# Patient Record
Sex: Female | Born: 1935 | Race: White | Hispanic: No | Smoking: Current every day smoker
Health system: Southern US, Community
[De-identification: ages and names within clinical notes are randomized; demographics above are authoritative.]

## PROBLEM LIST (undated history)

## (undated) DIAGNOSIS — G2581 Restless legs syndrome: Secondary | ICD-10-CM

## (undated) HISTORY — PX: TONSILLECTOMY: SUR1361

## (undated) HISTORY — PX: ABDOMINAL HYSTERECTOMY: SHX81

---

## 2000-05-02 ENCOUNTER — Other Ambulatory Visit: Admission: RE | Admit: 2000-05-02 | Discharge: 2000-05-02 | Payer: Self-pay | Admitting: Surgery

## 2000-05-02 ENCOUNTER — Encounter (INDEPENDENT_AMBULATORY_CARE_PROVIDER_SITE_OTHER): Payer: Self-pay | Admitting: Specialist

## 2004-03-23 ENCOUNTER — Ambulatory Visit (HOSPITAL_COMMUNITY): Admission: RE | Admit: 2004-03-23 | Discharge: 2004-03-23 | Payer: Self-pay | Admitting: *Deleted

## 2007-11-06 ENCOUNTER — Emergency Department (HOSPITAL_COMMUNITY): Admission: EM | Admit: 2007-11-06 | Discharge: 2007-11-06 | Payer: Self-pay | Admitting: Emergency Medicine

## 2009-01-10 ENCOUNTER — Observation Stay (HOSPITAL_COMMUNITY): Admission: EM | Admit: 2009-01-10 | Discharge: 2009-01-11 | Payer: Self-pay | Admitting: Emergency Medicine

## 2009-01-11 ENCOUNTER — Encounter (INDEPENDENT_AMBULATORY_CARE_PROVIDER_SITE_OTHER): Payer: Self-pay | Admitting: Internal Medicine

## 2010-08-20 LAB — COMPREHENSIVE METABOLIC PANEL
AST: 25 U/L (ref 0–37)
Albumin: 3.5 g/dL (ref 3.5–5.2)
Albumin: 3.5 g/dL (ref 3.5–5.2)
BUN: 13 mg/dL (ref 6–23)
CO2: 32 mEq/L (ref 19–32)
Calcium: 8.5 mg/dL (ref 8.4–10.5)
Calcium: 9.2 mg/dL (ref 8.4–10.5)
Creatinine, Ser: 0.57 mg/dL (ref 0.4–1.2)
GFR calc Af Amer: >60 mL/min (ref >60)
GFR calc non Af Amer: >60 mL/min (ref >60)
Glucose, Bld: 93 mg/dL (ref 70–99)
Sodium: 136 mEq/L (ref 135–145)
Total Protein: 6.3 g/dL (ref 6.0–8.3)
Total Protein: 6.5 g/dL (ref 6.0–8.3)

## 2010-08-20 LAB — LIPID PANEL
LDL Cholesterol: 151 mg/dL — ABNORMAL HIGH (ref 0–99)
Triglycerides: 108 mg/dL (ref <150)
VLDL: 22 mg/dL (ref 0–40)

## 2010-08-20 LAB — POCT CARDIAC MARKERS
Myoglobin, poc: 56.6 ng/mL (ref 12–200)
Troponin i, poc: <0.05 ng/mL (ref 0.00–0.09)

## 2010-08-20 LAB — CBC
Hemoglobin: 13 g/dL (ref 12.0–15.0)
MCHC: 33.4 g/dL (ref 30.0–36.0)
Platelets: 346 10*3/uL (ref 150–400)
RDW: 12.7 % (ref 11.5–15.5)

## 2010-08-20 LAB — CK TOTAL AND CKMB (NOT AT ARMC)
CK, MB: 1.8 ng/mL (ref 0.3–4.0)
Relative Index: INVALID (ref 0.0–2.5)
Total CK: 57 U/L (ref 7–177)

## 2010-08-20 LAB — DIFFERENTIAL
Lymphs Abs: 1.6 10*3/uL (ref 0.7–4.0)
Monocytes Absolute: 0.6 10*3/uL (ref 0.1–1.0)
Monocytes Relative: 10 % (ref 3–12)
Neutro Abs: 3.5 10*3/uL (ref 1.7–7.7)
Neutrophils Relative %: 58 % (ref 43–77)

## 2010-08-20 LAB — PROTIME-INR
INR: 1 (ref 0.00–1.49)
Prothrombin Time: 12.8 seconds (ref 11.6–15.2)

## 2010-08-20 LAB — CARDIAC PANEL(CRET KIN+CKTOT+MB+TROPI)
CK, MB: 1.8 ng/mL (ref 0.3–4.0)
CK, MB: 1.8 ng/mL (ref 0.3–4.0)
Troponin I: 0.02 ng/mL (ref 0.00–0.06)

## 2010-08-20 LAB — APTT: aPTT: 29 seconds (ref 24–37)

## 2010-08-20 LAB — TSH: TSH: 7.121 u[IU]/mL — ABNORMAL HIGH (ref 0.350–4.500)

## 2010-09-27 NOTE — H&P (Signed)
NAME:  Alexa Palmer, Alexa Palmer NO.:  [id number]   MEDICAL RECORD NO.:  [id number]          PATIENT TYPE:  EMS   LOCATION:  MAJO                         FACILITY:  [facility]   PHYSICIAN:  Michiel Cowboy, MDDATE OF BIRTH:  09-11-1933   DATE OF ADMISSION:  01/10/2009  DATE OF DISCHARGE:                              HISTORY & PHYSICAL   PRIMARY CARE [facility]:  Dr. Miguel Aschoff.   CHIEF COMPLAINT:  Chest pain.   Patient is a 77 year old female with past medical history significant  for restless leg syndrome and mild depression.  Patient has been doing  actually fairly well, was saying she was at her baseline of health when  she went to church this morning.  While sitting down in church, she  developed substernal chest pain which she describes as somewhat sharp,  drumming like, was not worse with deep inspiration or position.  She did  have some shortness of breath associated with this.  It also radiated  somewhat to her upper, as well as lower jaws.  She had a mild headache  and felt a little clammy.  This has lasted for about 10 minutes and then  she was assessed by a nurse who was at her baseline who recommended  further evaluation.  Of note, she has had those episodes before over  past year so it happened about twice or so, usually not at the time of  exertion but rather at rest.  She does exercise regularly and never has  any chest pain or shortness of breath while exercising.  She denies any  extra stress during her life currently.  No fevers.  No chills.  No  diarrhea.  No constipation.   REVIEW OF SYSTEMS:  Otherwise negative.  No lower extremity edema.  No  dyspnea on exertion and no PND or orthopnea noted.   REVIEW OF SYSTEMS:  Otherwise unremarkable.   PAST MEDICAL HISTORY:  Significant for:  1. Depression.  2. Restless leg syndrome.   SOCIAL HISTORY:  Patient does not smoke or drink.  Does not abuse drugs.   FAMILY HISTORY:  Only significant for breast  cancer in mother and her  sister.   ALLERGIES:  PENICILLIN.   MEDICATIONS:  1. Prozac, unknown dose.  2. Estradiol, unknown dose.  3. Also an older drug for restless leg syndrome, name of which she      cannot remember.   VITALS:  Temperature 96.8.  Blood pressure 108/61.  Pulse 52.  Respirations 14.  Saturation 98% on room air.  Patient appears to be in  no acute distress.  HEAD:  Nontraumatic.  Moist mucous membranes.  LUNGS:  Clear to auscultation bilaterally.  HEART:  Regular rate and rhythm.  No murmurs appreciated.  ABDOMEN:  Soft, nontender, nondistended.  LOWER EXTREMITIES:  No clubbing, cyanosis, or edema.  NEUROLOGIC:  The patient grossly intact.  SKIN:  Clean, dry, and intact.   LABS:  White blood cell count 6.0, hemoglobin 13, sodium 136, potassium  5.3 but is hemolyzed specimen, creatinine 0.59, total bili 1.4, AST 43,  ALT 15, albumin 3.5.  Chest x-ray showing cardiomegaly but  no acute  cardiopulmonary disease.  Cardiac enzymes as negative.  EKG showing no  evidence of ischemic changes, heart rate of 52 but sinus bradycardia.   ASSESSMENT AND PLAN:  1. This is a 77 year old female with chest pain.  No significant risk      factors except for her age but recurrent episodes of chest pain      which are somewhat atypical.  For the chest pain, given age we will      cycle cardiac markers.  Given cardiomegaly on the chest x-ray, we      will obtain a 2D echo.  Given sharp-like sensation, we will obtain      a D-dimer, especially since she had some associated shortness of      breath although pulmonary embolism is less likely.  2. Elevated potassium, likely secondary to hemolysis.  We will repeat.   PROPHYLAXIS:  Protonix and Lovenox.      Michiel Cowboy, MD  Electronically Signed     AVD/MEDQ  D:  01/10/2009  T:  01/10/2009  Job:  [id number]   cc:   Miguel Aschoff, M.D.

## 2010-09-27 NOTE — Discharge Summary (Signed)
NAME:  Alexa Palmer, Alexa Palmer NO.:  [id number]   MEDICAL RECORD NO.:  [id number]          PATIENT TYPE:  INP   LOCATION:  2004                         FACILITY:  [facility]   PHYSICIAN:  Michiel Cowboy, MDDATE OF BIRTH:  [birth year]   DATE OF ADMISSION:  01/10/2009  DATE OF DISCHARGE:  01/11/2009                               DISCHARGE SUMMARY   .   DISCHARGE DIAGNOSIS:  1. Chest pain.  2. Cardiomegaly with normal echogram.  3. Hyperlipidemia.  4. Restless leg syndrome.  5. Mild depression.   STUDIES:  EKG showing no ischemic changes.  Cardiac markers negative x3.  Chest x-ray showing cardiomegaly.   Echogram done on 08/30 of showing left ventricle normal size.  Systolic  function was normal.  Wall motion normal.  Mild aortic regurgitation.   HOSPITAL COURSE:  The patient is a pleasant 77 year old female who had  been having intermittent chest pains in the past usually nonexertional.  She had an episode lasting about 10 minutes while she was at church with  some associated shortness of breath and diaphoresis.  She was evaluated  for chest pain.  Cardiac markers were negative x3.  No EKG changes were  noted.  Her echo did not show any wall motion abnormality.  The patient  was set up to see Dr. Anne Fu in the office in the a.m. tomorrow and will  probably  benefit from stress test as an outpatient since she is having  recurrent episode of chest pain and reportedly never had a stress as  before.  Of note, her cholesterol was noted to be elevated.  She was  started on Lipitor prior to discharge as well as baby aspirin.  If her  stress test is normal and she wishes to try to control her lipids with  diet only, this could be addressed as an outpatient after she has been  seen by cardiology.   DISCHARGE MEDICATIONS:  1. Lipitor 10 mg p.o. daily.  2. Aspirin 81 mg p.o. daily.  3. Prozac resume home dose.  4. Estradiol can resume home dose.   The patient is to hold  quinine that she has been taking for her restless  leg as this can cause arrhythmias until she has been seen by  cardiologist.   FOLLOW UP:  The patient is to see Dr. Anne Fu on August 31 at 9:00 a.m.  From there her stress test could be arranged.  The patient to see her  primary care [facility], Dr. Miguel Aschoff within a week.      Michiel Cowboy, MD  Electronically Signed     AVD/MEDQ  D:  01/11/2009  T:  01/11/2009  Job:  [id number]   cc:   Miguel Aschoff, M.D.  Jake Bathe, MD

## 2012-03-20 ENCOUNTER — Emergency Department (HOSPITAL_COMMUNITY)
Admission: EM | Admit: 2012-03-20 | Discharge: 2012-03-20 | Disposition: A | Discharge status: Home / Self Care | Payer: Medicare Other | Attending: Emergency Medicine | Admitting: Emergency Medicine

## 2012-03-20 ENCOUNTER — Emergency Department (HOSPITAL_COMMUNITY): Payer: Medicare Other

## 2012-03-20 ENCOUNTER — Encounter (HOSPITAL_COMMUNITY): Payer: Self-pay | Admitting: *Deleted

## 2012-03-20 DIAGNOSIS — Y9302 Activity, running: Secondary | ICD-10-CM | POA: Insufficient documentation

## 2012-03-20 DIAGNOSIS — Z79899 Other long term (current) drug therapy: Secondary | ICD-10-CM | POA: Insufficient documentation

## 2012-03-20 DIAGNOSIS — W19XXXA Unspecified fall, initial encounter: Secondary | ICD-10-CM

## 2012-03-20 DIAGNOSIS — W1789XA Other fall from one level to another, initial encounter: Secondary | ICD-10-CM | POA: Insufficient documentation

## 2012-03-20 DIAGNOSIS — Y929 Unspecified place or not applicable: Secondary | ICD-10-CM | POA: Insufficient documentation

## 2012-03-20 DIAGNOSIS — S1093XA Contusion of unspecified part of neck, initial encounter: Secondary | ICD-10-CM | POA: Insufficient documentation

## 2012-03-20 DIAGNOSIS — R51 Headache: Secondary | ICD-10-CM | POA: Insufficient documentation

## 2012-03-20 DIAGNOSIS — G2581 Restless legs syndrome: Secondary | ICD-10-CM | POA: Insufficient documentation

## 2012-03-20 DIAGNOSIS — S0093XA Contusion of unspecified part of head, initial encounter: Secondary | ICD-10-CM

## 2012-03-20 DIAGNOSIS — S0003XA Contusion of scalp, initial encounter: Secondary | ICD-10-CM | POA: Insufficient documentation

## 2012-03-20 HISTORY — DX: Restless legs syndrome: G25.81

## 2012-03-20 LAB — CBC WITH DIFFERENTIAL/PLATELET
Basophils Absolute: 0 10*3/uL (ref 0.0–0.1)
Basophils Relative: 1 % (ref 0–1)
Eosinophils Relative: 4 % (ref 0–5)
HCT: 35.6 % — ABNORMAL LOW (ref 36.0–46.0)
MCHC: 34.6 g/dL (ref 30.0–36.0)
MCV: 93.2 fL (ref 78.0–100.0)
Monocytes Absolute: 0.3 10*3/uL (ref 0.1–1.0)
Neutro Abs: 3 10*3/uL (ref 1.7–7.7)
RDW: 12.7 % (ref 11.5–15.5)

## 2012-03-20 LAB — BASIC METABOLIC PANEL
Calcium: 8.9 mg/dL (ref 8.4–10.5)
Creatinine, Ser: 0.64 mg/dL (ref 0.50–1.10)
GFR calc Af Amer: >90 mL/min (ref >90)

## 2012-03-20 MED ORDER — HYDROCODONE-ACETAMINOPHEN 5-325 MG PO TABS: ORAL_TABLET | ORAL | Status: AC

## 2012-03-20 MED ORDER — IBUPROFEN 800 MG PO TABS
ORAL_TABLET | ORAL | Status: AC
  Administered 2012-03-20: 800 mg via ORAL
  Filled 2012-03-20: qty 1

## 2012-03-20 MED ORDER — IBUPROFEN 800 MG PO TABS
800.0000 mg | ORAL_TABLET | Freq: Once | ORAL | Status: AC
  Administered 2012-03-20: 800 mg via ORAL

## 2012-03-20 NOTE — ED Notes (Signed)
Pt reports she was out for her morning run and fell. Pt reports she feels very disoriented and confused. Pt able to state where she is, year and president. Does not know month, does not remember falling or what she could have hit her head on. Pt presents with clothing very bloody from head lac. Small 1cm lac noted to area above L eyebrow. Bleeding controlled currently. Swelling and bruising noted to same area. Pt denies pain elsewhere, only pain is at head lac.

## 2012-03-20 NOTE — ED Provider Notes (Signed)
History     CSN: [id number]  Arrival date & time 03/20/12  0756   First MD Initiated Contact with Patient 03/20/12 0759      Chief Complaint  Patient presents with  . Fall  . Head Laceration    (Consider location/radiation/quality/duration/timing/severity/associated sxs/prior treatment) Patient is a 78 y.o. female presenting with fall and scalp laceration. The history is provided by the patient (the pt states she fell and hit her head.  she has a headache and left chest pain). No language interpreter was used.  Fall The accident occurred 1 to 2 hours ago. The fall occurred while running. She fell from a height of 1 to 2 ft. She landed on concrete. The point of impact was the head. The pain is present in the head. The pain is at a severity of 4/10. The pain is moderate. She was ambulatory at the scene. There was no entrapment after the fall. There was no drug use involved in the accident. There was no alcohol use involved in the accident. Associated symptoms include headaches. Pertinent negatives include no visual change, no abdominal pain and no hematuria. Exacerbated by: nothing. She has tried nothing for the symptoms.  Head Laceration Associated symptoms include chest pain and headaches. Pertinent negatives include no abdominal pain.    Past Medical History  Diagnosis Date  . Restless leg syndrome     Past Surgical History  Procedure Date  . Abdominal hysterectomy     No family history on file.  History  Substance Use Topics  . Smoking status: Not on file  . Smokeless tobacco: Not on file  . Alcohol Use:     OB History    Grav Para Term Preterm Abortions TAB SAB Ect Mult Living                  Review of Systems  Constitutional: Negative for fatigue.  HENT: Negative for congestion, sinus pressure and ear discharge.   Eyes: Negative for discharge.  Respiratory: Negative for cough.   Cardiovascular: Positive for chest pain.  Gastrointestinal: Negative for  abdominal pain and diarrhea.  Genitourinary: Negative for frequency and hematuria.  Musculoskeletal: Negative for back pain.  Skin: Negative for rash.  Neurological: Positive for headaches. Negative for seizures.  Hematological: Negative.   Psychiatric/Behavioral: Negative for hallucinations.    Allergies  Penicillins  Home Medications   Current Outpatient Rx  Name  Route  Sig  Dispense  Refill  . CLONAZEPAM 0.5 MG PO TABS   Oral   Take 0.5 mg by mouth every evening.         Marland Kitchen ESTRADIOL 1 MG PO TABS   Oral   Take 1 mg by mouth daily.         Marland Kitchen FLUOXETINE HCL 10 MG PO CAPS   Oral   Take 10 mg by mouth daily.         Marland Kitchen HYDROCODONE-ACETAMINOPHEN 5-325 MG PO TABS      Take one every 6 hours for pain not helped by tylenol or motrin   20 tablet   0     BP 141/66  Pulse 53  Temp 97.7 F (36.5 C) (Oral)  Resp 20  SpO2 97%  Physical Exam  Constitutional: She is oriented to person, place, and time. She appears well-developed.  HENT:  Head: Normocephalic.       Abrasions and swelling left forehead  Eyes: Conjunctivae normal and EOM are normal. No scleral icterus.  Neck: Neck supple. No  thyromegaly present.  Cardiovascular: Normal rate and regular rhythm.  Exam reveals no gallop and no friction rub.   No murmur heard. Pulmonary/Chest: No stridor. She has no wheezes. She has no rales. She exhibits tenderness.       Tender left chest  Abdominal: She exhibits no distension. There is no tenderness. There is no rebound.  Musculoskeletal: Normal range of motion. She exhibits no edema.  Lymphadenopathy:    She has no cervical adenopathy.  Neurological: She is oriented to person, place, and time. Coordination normal.  Skin: No rash noted. No erythema.  Psychiatric: She has a normal mood and affect. Her behavior is normal.    ED Course  Procedures (including critical care time)  Labs Reviewed  CBC WITH DIFFERENTIAL - Abnormal; Notable for the following:    RBC  3.82 (*)     HCT 35.6 (*)     All other components within normal limits  BASIC METABOLIC PANEL - Abnormal; Notable for the following:    GFR calc non Af Amer 85 (*)     All other components within normal limits   Dg Ribs Unilateral W/chest Left  03/20/2012  *RADIOLOGY REPORT*  Clinical Data: Fall.  Head laceration.  LEFT RIBS AND CHEST - 3+ VIEW  Comparison: 01/10/2009.  Findings: Chronic bronchitic changes are present.  Emphysema. Cardiopericardial silhouette appears within normal limits. No pneumothorax.  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 Left old left eighth and ninth rib fractures are present, previously demonstrated 11/06/2007.  No acute displaced rib fractures are identified.  No pleural fluid.  IMPRESSION: Old left-sided rib fractures.  No acute displaced rib fracture identified.   Original Report Authenticated By: Andreas Newport, M.D.    Ct Head Wo Contrast  03/20/2012  *RADIOLOGY REPORT*  Clinical Data:  Fall, head laceration, neck pain  CT HEAD WITHOUT CONTRAST CT CERVICAL SPINE WITHOUT CONTRAST  Technique:  Multidetector CT imaging of the head and cervical spine was performed following the standard protocol without intravenous contrast.  Multiplanar CT image reconstructions of the cervical spine were also generated.  Comparison:   None  CT HEAD  Findings: Periventricular and subcortical white matter hypodensities are nonspecific however most in keeping with chronic microangiopathic change. There is no evidence for acute hemorrhage, hydrocephalus, mass lesion, or abnormal extra-axial fluid collection.  No definite CT evidence for acute infarction.  Left scalp hematoma superolateral to the left orbit.  Small associated locule of gas.  No underlying fracture.  Globes are symmetric.  No retrobulbar hematoma.The visualized paranasal sinuses and mastoid air cells are predominately clear.  IMPRESSION: Left scalp hematoma.  No underlying calvarial fracture or acute intracranial  abnormality.  Nonspecific white matter changes as above.  CT CERVICAL SPINE  Findings: Limited images through the lung apices shows scarring/posterior reticular high attenuation.  Multilevel facet arthropathy and multilevel degenerative disc disease, most pronounced at C6-7.  Maintained craniocervical relationship.  No dens fracture.  There is minimal anterolisthesis of C4 on C5 and C5 on C6. Maintained vertebral body heights. Paravertebral soft tissues within normal limits.  IMPRESSION: Multilevel degenerative changes and minimal C4 on C5 and C5 on C6 anterolisthesis.  No static evidence for acute fracture or dislocation.  Nonspecific peripheral/subpleural calcific interstitial thickening, perhaps pulmonary ossification in the setting of underlying fibrosis.  Consider a non emergent chest CT follow-up.   Original Report Authenticated By: Jearld Lesch, M.D.    Ct Cervical Spine Wo Contrast  03/20/2012  *RADIOLOGY REPORT*  Clinical Data:  Fall,  head laceration, neck pain  CT HEAD WITHOUT CONTRAST CT CERVICAL SPINE WITHOUT CONTRAST  Technique:  Multidetector CT imaging of the head and cervical spine was performed following the standard protocol without intravenous contrast.  Multiplanar CT image reconstructions of the cervical spine were also generated.  Comparison:   None  CT HEAD  Findings: Periventricular and subcortical white matter hypodensities are nonspecific however most in keeping with chronic microangiopathic change. There is no evidence for acute hemorrhage, hydrocephalus, mass lesion, or abnormal extra-axial fluid collection.  No definite CT evidence for acute infarction.  Left scalp hematoma superolateral to the left orbit.  Small associated locule of gas.  No underlying fracture.  Globes are symmetric.  No retrobulbar hematoma.The visualized paranasal sinuses and mastoid air cells are predominately clear.  IMPRESSION: Left scalp hematoma.  No underlying calvarial fracture or acute intracranial  abnormality.  Nonspecific white matter changes as above.  CT CERVICAL SPINE  Findings: Limited images through the lung apices shows scarring/posterior reticular high attenuation.  Multilevel facet arthropathy and multilevel degenerative disc disease, most pronounced at C6-7.  Maintained craniocervical relationship.  No dens fracture.  There is minimal anterolisthesis of C4 on C5 and C5 on C6. Maintained vertebral body heights. Paravertebral soft tissues within normal limits.  IMPRESSION: Multilevel degenerative changes and minimal C4 on C5 and C5 on C6 anterolisthesis.  No static evidence for acute fracture or dislocation.  Nonspecific peripheral/subpleural calcific interstitial thickening, perhaps pulmonary ossification in the setting of underlying fibrosis.  Consider a non emergent chest CT follow-up.   Original Report Authenticated By: Jearld Lesch, M.D.      1. Fall   2. Head contusion       MDM          Benny Lennert, MD 03/20/12 1136

## 2015-02-15 DIAGNOSIS — H0019 Chalazion unspecified eye, unspecified eyelid: Secondary | ICD-10-CM | POA: Diagnosis not present

## 2015-02-15 DIAGNOSIS — L814 Other melanin hyperpigmentation: Secondary | ICD-10-CM | POA: Diagnosis not present

## 2015-02-15 DIAGNOSIS — D225 Melanocytic nevi of trunk: Secondary | ICD-10-CM | POA: Diagnosis not present

## 2015-02-15 DIAGNOSIS — L821 Other seborrheic keratosis: Secondary | ICD-10-CM | POA: Diagnosis not present

## 2015-03-03 DIAGNOSIS — Z0100 Encounter for examination of eyes and vision without abnormal findings: Secondary | ICD-10-CM | POA: Diagnosis not present

## 2015-03-03 DIAGNOSIS — H5203 Hypermetropia, bilateral: Secondary | ICD-10-CM | POA: Diagnosis not present

## 2015-03-19 DIAGNOSIS — C44119 Basal cell carcinoma of skin of left eyelid, including canthus: Secondary | ICD-10-CM | POA: Diagnosis not present

## 2015-03-19 DIAGNOSIS — D485 Neoplasm of uncertain behavior of skin: Secondary | ICD-10-CM | POA: Diagnosis not present

## 2015-04-27 DIAGNOSIS — H2511 Age-related nuclear cataract, right eye: Secondary | ICD-10-CM | POA: Diagnosis not present

## 2015-04-27 DIAGNOSIS — H2512 Age-related nuclear cataract, left eye: Secondary | ICD-10-CM | POA: Diagnosis not present

## 2015-04-27 DIAGNOSIS — H02839 Dermatochalasis of unspecified eye, unspecified eyelid: Secondary | ICD-10-CM | POA: Diagnosis not present

## 2015-04-27 DIAGNOSIS — H18411 Arcus senilis, right eye: Secondary | ICD-10-CM | POA: Diagnosis not present

## 2015-06-08 DIAGNOSIS — C44119 Basal cell carcinoma of skin of left eyelid, including canthus: Secondary | ICD-10-CM | POA: Diagnosis not present

## 2015-08-11 DIAGNOSIS — R69 Illness, unspecified: Secondary | ICD-10-CM | POA: Diagnosis not present

## 2015-08-24 DIAGNOSIS — H18411 Arcus senilis, right eye: Secondary | ICD-10-CM | POA: Diagnosis not present

## 2015-08-24 DIAGNOSIS — H2512 Age-related nuclear cataract, left eye: Secondary | ICD-10-CM | POA: Diagnosis not present

## 2015-08-24 DIAGNOSIS — H02839 Dermatochalasis of unspecified eye, unspecified eyelid: Secondary | ICD-10-CM | POA: Diagnosis not present

## 2015-08-24 DIAGNOSIS — H2511 Age-related nuclear cataract, right eye: Secondary | ICD-10-CM | POA: Diagnosis not present

## 2015-10-04 DIAGNOSIS — Z9841 Cataract extraction status, right eye: Secondary | ICD-10-CM | POA: Diagnosis not present

## 2015-10-04 DIAGNOSIS — H5211 Myopia, right eye: Secondary | ICD-10-CM | POA: Diagnosis not present

## 2015-10-04 DIAGNOSIS — H2511 Age-related nuclear cataract, right eye: Secondary | ICD-10-CM | POA: Diagnosis not present

## 2015-10-04 DIAGNOSIS — Z961 Presence of intraocular lens: Secondary | ICD-10-CM | POA: Diagnosis not present

## 2015-10-04 DIAGNOSIS — H25811 Combined forms of age-related cataract, right eye: Secondary | ICD-10-CM | POA: Diagnosis not present

## 2015-10-18 DIAGNOSIS — H5202 Hypermetropia, left eye: Secondary | ICD-10-CM | POA: Diagnosis not present

## 2015-10-18 DIAGNOSIS — H5211 Myopia, right eye: Secondary | ICD-10-CM | POA: Diagnosis not present

## 2015-10-18 DIAGNOSIS — H52223 Regular astigmatism, bilateral: Secondary | ICD-10-CM | POA: Diagnosis not present

## 2015-10-18 DIAGNOSIS — H2512 Age-related nuclear cataract, left eye: Secondary | ICD-10-CM | POA: Diagnosis not present

## 2015-10-18 DIAGNOSIS — H25812 Combined forms of age-related cataract, left eye: Secondary | ICD-10-CM | POA: Diagnosis not present

## 2015-10-18 DIAGNOSIS — Z961 Presence of intraocular lens: Secondary | ICD-10-CM | POA: Diagnosis not present

## 2015-10-18 DIAGNOSIS — H20012 Primary iridocyclitis, left eye: Secondary | ICD-10-CM | POA: Diagnosis not present

## 2015-10-18 DIAGNOSIS — Z9842 Cataract extraction status, left eye: Secondary | ICD-10-CM | POA: Diagnosis not present

## 2016-02-04 DIAGNOSIS — E7800 Pure hypercholesterolemia, unspecified: Secondary | ICD-10-CM | POA: Diagnosis not present

## 2016-02-04 DIAGNOSIS — Z23 Encounter for immunization: Secondary | ICD-10-CM | POA: Diagnosis not present

## 2016-02-04 DIAGNOSIS — Z0000 Encounter for general adult medical examination without abnormal findings: Secondary | ICD-10-CM | POA: Diagnosis not present

## 2016-02-04 DIAGNOSIS — Z1389 Encounter for screening for other disorder: Secondary | ICD-10-CM | POA: Diagnosis not present

## 2016-02-04 DIAGNOSIS — R69 Illness, unspecified: Secondary | ICD-10-CM | POA: Diagnosis not present

## 2016-02-04 DIAGNOSIS — Z79899 Other long term (current) drug therapy: Secondary | ICD-10-CM | POA: Diagnosis not present

## 2016-02-04 DIAGNOSIS — M810 Age-related osteoporosis without current pathological fracture: Secondary | ICD-10-CM | POA: Diagnosis not present

## 2016-02-22 DIAGNOSIS — Z1231 Encounter for screening mammogram for malignant neoplasm of breast: Secondary | ICD-10-CM | POA: Diagnosis not present

## 2016-02-22 DIAGNOSIS — Z803 Family history of malignant neoplasm of breast: Secondary | ICD-10-CM | POA: Diagnosis not present

## 2016-06-29 DIAGNOSIS — L821 Other seborrheic keratosis: Secondary | ICD-10-CM | POA: Diagnosis not present

## 2016-06-29 DIAGNOSIS — D2339 Other benign neoplasm of skin of other parts of face: Secondary | ICD-10-CM | POA: Diagnosis not present

## 2016-06-29 DIAGNOSIS — L570 Actinic keratosis: Secondary | ICD-10-CM | POA: Diagnosis not present

## 2016-06-29 DIAGNOSIS — D2239 Melanocytic nevi of other parts of face: Secondary | ICD-10-CM | POA: Diagnosis not present

## 2016-06-29 DIAGNOSIS — D235 Other benign neoplasm of skin of trunk: Secondary | ICD-10-CM | POA: Diagnosis not present

## 2016-06-29 DIAGNOSIS — L111 Transient acantholytic dermatosis [Grover]: Secondary | ICD-10-CM | POA: Diagnosis not present

## 2016-06-29 DIAGNOSIS — L719 Rosacea, unspecified: Secondary | ICD-10-CM | POA: Diagnosis not present

## 2016-06-29 DIAGNOSIS — L814 Other melanin hyperpigmentation: Secondary | ICD-10-CM | POA: Diagnosis not present

## 2016-08-08 DIAGNOSIS — R69 Illness, unspecified: Secondary | ICD-10-CM | POA: Diagnosis not present

## 2016-09-26 DIAGNOSIS — H52223 Regular astigmatism, bilateral: Secondary | ICD-10-CM | POA: Diagnosis not present

## 2016-09-26 DIAGNOSIS — H5203 Hypermetropia, bilateral: Secondary | ICD-10-CM | POA: Diagnosis not present

## 2016-09-26 DIAGNOSIS — H524 Presbyopia: Secondary | ICD-10-CM | POA: Diagnosis not present

## 2016-09-29 DIAGNOSIS — Z0100 Encounter for examination of eyes and vision without abnormal findings: Secondary | ICD-10-CM | POA: Diagnosis not present

## 2017-02-12 DIAGNOSIS — G2581 Restless legs syndrome: Secondary | ICD-10-CM | POA: Diagnosis not present

## 2017-02-12 DIAGNOSIS — Z79899 Other long term (current) drug therapy: Secondary | ICD-10-CM | POA: Diagnosis not present

## 2017-02-12 DIAGNOSIS — Z0000 Encounter for general adult medical examination without abnormal findings: Secondary | ICD-10-CM | POA: Diagnosis not present

## 2017-02-12 DIAGNOSIS — Z1389 Encounter for screening for other disorder: Secondary | ICD-10-CM | POA: Diagnosis not present

## 2017-02-12 DIAGNOSIS — M810 Age-related osteoporosis without current pathological fracture: Secondary | ICD-10-CM | POA: Diagnosis not present

## 2017-02-12 DIAGNOSIS — E7800 Pure hypercholesterolemia, unspecified: Secondary | ICD-10-CM | POA: Diagnosis not present

## 2017-02-12 DIAGNOSIS — R69 Illness, unspecified: Secondary | ICD-10-CM | POA: Diagnosis not present

## 2017-04-09 DIAGNOSIS — H00014 Hordeolum externum left upper eyelid: Secondary | ICD-10-CM | POA: Diagnosis not present

## 2017-04-10 DIAGNOSIS — H00024 Hordeolum internum left upper eyelid: Secondary | ICD-10-CM | POA: Diagnosis not present

## 2017-07-25 DIAGNOSIS — M810 Age-related osteoporosis without current pathological fracture: Secondary | ICD-10-CM | POA: Diagnosis not present

## 2017-07-25 DIAGNOSIS — M8588 Other specified disorders of bone density and structure, other site: Secondary | ICD-10-CM | POA: Diagnosis not present

## 2017-08-07 DIAGNOSIS — R69 Illness, unspecified: Secondary | ICD-10-CM | POA: Diagnosis not present

## 2017-10-03 DIAGNOSIS — H52222 Regular astigmatism, left eye: Secondary | ICD-10-CM | POA: Diagnosis not present

## 2017-10-03 DIAGNOSIS — Z0100 Encounter for examination of eyes and vision without abnormal findings: Secondary | ICD-10-CM | POA: Diagnosis not present

## 2017-10-03 DIAGNOSIS — H524 Presbyopia: Secondary | ICD-10-CM | POA: Diagnosis not present

## 2017-10-03 DIAGNOSIS — H5203 Hypermetropia, bilateral: Secondary | ICD-10-CM | POA: Diagnosis not present

## 2017-12-25 DIAGNOSIS — R05 Cough: Secondary | ICD-10-CM | POA: Diagnosis not present

## 2018-01-03 DIAGNOSIS — R05 Cough: Secondary | ICD-10-CM | POA: Diagnosis not present

## 2018-01-12 DIAGNOSIS — R05 Cough: Secondary | ICD-10-CM | POA: Diagnosis not present

## 2018-01-22 ENCOUNTER — Other Ambulatory Visit: Payer: Self-pay | Admitting: Geriatric Medicine

## 2018-01-22 ENCOUNTER — Ambulatory Visit
Admission: RE | Admit: 2018-01-22 | Discharge: 2018-01-22 | Disposition: A | Discharge status: Home / Self Care | Payer: Medicare HMO | Source: Ambulatory Visit | Attending: Geriatric Medicine | Admitting: Geriatric Medicine

## 2018-01-22 DIAGNOSIS — R059 Cough, unspecified: Secondary | ICD-10-CM

## 2018-01-22 DIAGNOSIS — R05 Cough: Secondary | ICD-10-CM

## 2018-01-28 ENCOUNTER — Encounter (HOSPITAL_COMMUNITY): Payer: Self-pay

## 2018-01-28 ENCOUNTER — Other Ambulatory Visit: Payer: Self-pay

## 2018-01-28 ENCOUNTER — Emergency Department (HOSPITAL_COMMUNITY)
Admission: EM | Admit: 2018-01-28 | Discharge: 2018-01-28 | Disposition: A | Discharge status: Hospice | Payer: Medicare HMO | Attending: Emergency Medicine | Admitting: Emergency Medicine

## 2018-01-28 ENCOUNTER — Emergency Department (HOSPITAL_COMMUNITY): Payer: Medicare HMO

## 2018-01-28 DIAGNOSIS — Y999 Unspecified external cause status: Secondary | ICD-10-CM | POA: Insufficient documentation

## 2018-01-28 DIAGNOSIS — S99921A Unspecified injury of right foot, initial encounter: Secondary | ICD-10-CM | POA: Diagnosis not present

## 2018-01-28 DIAGNOSIS — Z79899 Other long term (current) drug therapy: Secondary | ICD-10-CM | POA: Insufficient documentation

## 2018-01-28 DIAGNOSIS — F17210 Nicotine dependence, cigarettes, uncomplicated: Secondary | ICD-10-CM | POA: Insufficient documentation

## 2018-01-28 DIAGNOSIS — R69 Illness, unspecified: Secondary | ICD-10-CM | POA: Diagnosis not present

## 2018-01-28 DIAGNOSIS — W208XXA Other cause of strike by thrown, projected or falling object, initial encounter: Secondary | ICD-10-CM | POA: Insufficient documentation

## 2018-01-28 DIAGNOSIS — Y929 Unspecified place or not applicable: Secondary | ICD-10-CM | POA: Insufficient documentation

## 2018-01-28 DIAGNOSIS — M79671 Pain in right foot: Secondary | ICD-10-CM | POA: Diagnosis not present

## 2018-01-28 DIAGNOSIS — S9031XA Contusion of right foot, initial encounter: Secondary | ICD-10-CM | POA: Insufficient documentation

## 2018-01-28 DIAGNOSIS — Y93B9 Activity, other involving muscle strengthening exercises: Secondary | ICD-10-CM | POA: Diagnosis not present

## 2018-01-28 MED ORDER — ONDANSETRON 4 MG PO TBDP
4.0000 mg | ORAL_TABLET | Freq: Once | ORAL | Status: AC
  Administered 2018-01-28: 4 mg via ORAL
  Filled 2018-01-28: qty 1

## 2018-01-28 MED ORDER — ACETAMINOPHEN 325 MG PO TABS
650.0000 mg | ORAL_TABLET | Freq: Once | ORAL | Status: AC
  Administered 2018-01-28: 650 mg via ORAL
  Filled 2018-01-28: qty 2

## 2018-01-28 NOTE — ED Provider Notes (Signed)
[facility] DEPT Provider Note   CSN: [id number] Arrival date & time: 01/28/18  1514     History   Chief Complaint Chief Complaint  Patient presents with  . Foot Injury    HPI Alexa Palmer is a 84 y.o. female who presents to the ED with foot pain. Patient reports that she dripped a 2 pound weight on her right foot this morning while exercising. The pain has increased and the area is swollen.   HPI  Past Medical History:  Diagnosis Date  . Restless leg syndrome     There are no active problems to display for this patient.   Past Surgical History:  Procedure Laterality Date  . ABDOMINAL HYSTERECTOMY       OB History   None      Home Medications    Prior to Admission medications   Medication Sig Start Date End Date Taking? Authorizing Provider  clonazePAM (KLONOPIN) 0.5 MG tablet Take 0.5 mg by mouth every evening.    [provider]  estradiol (ESTRACE) 1 MG tablet Take 1 mg by mouth daily.    [provider]  FLUoxetine (PROZAC) 10 MG capsule Take 10 mg by mouth daily.    [provider]  HYDROcodone-acetaminophen (NORCO/VICODIN) 5-325 MG per tablet Take one every 6 hours for pain not helped by tylenol or motrin 03/20/12   Milton Ferguson, MD    Family History History reviewed. No pertinent family history.  Social History Social History   Tobacco Use  . Smoking status: Current Every Day Smoker  . Smokeless tobacco: Never Used  Substance Use Topics  . Alcohol use: Never    Frequency: Never  . Drug use: Never     Allergies   Penicillins   Review of Systems Review of Systems  Musculoskeletal: Positive for arthralgias.  All other systems reviewed and are negative.    Physical Exam Updated Vital Signs BP 125/66 (BP Location: Left Arm)   Pulse 64   Temp (!) 97.5 F (36.4 C) (Oral)   Resp 16   Ht 5\' 2"  (1.575 m)   Wt 49.9 kg   SpO2 97%   BMI 20.12 kg/m   Physical Exam    Constitutional: She appears well-developed and well-nourished. No distress.  HENT:  Head: Normocephalic.  Eyes: EOM are normal.  Neck: Neck supple.  Cardiovascular: Normal rate.  Pulmonary/Chest: Effort normal.  Musculoskeletal:       Right foot: There is tenderness and swelling.  Neurological: She is alert.  Skin: Skin is warm and dry.  Psychiatric: She has a normal mood and affect.  Nursing note and vitals reviewed.    ED Treatments / Results  Labs (all labs ordered are listed, but only abnormal results are displayed) Labs Reviewed - No data to display  Radiology Dg Foot Complete Right  Result Date: 01/28/2018 CLINICAL DATA:  Patient dropped a 2 pound weight on the foot. Pain is worsening. EXAM: RIGHT FOOT COMPLETE - 3+ VIEW COMPARISON:  None. FINDINGS: There is no evidence of fracture or dislocation. There is no evidence of erosive arthropathy. There is MTP joint degenerative change, particularly of the second digit. IMPRESSION: Negative for fracture. Electronically Signed   By: Staci Righter M.D.   On: 01/28/2018 16:32    Procedures Procedures (including critical care time)  Medications Ordered in ED Medications  acetaminophen (TYLENOL) tablet 650 mg (has no administration in time range)  ondansetron (ZOFRAN-ODT) disintegrating tablet 4 mg (has no administration in time  range)   Dr. Eulis Foster in to see the patient and discuss plan of care.   Initial Impression / Assessment and Plan / ED Course  I have reviewed the triage vital signs and the nursing notes. 84 y.o. female here with pain and swelling of the right foot stable for d/c without fracture or dislocation noted on x-ray. Will treat with tylenol and RICE. Patient to f/u with PCP or return here as needed.   Final Clinical Impressions(s) / ED Diagnoses   Final diagnoses:  Contusion of right foot, initial encounter    ED Discharge Orders    None       Debroah Baller [place], [place] 01/28/18 1649    Daleen Bo,  MD 02/01/18 1017

## 2018-01-28 NOTE — ED Triage Notes (Signed)
Patient states she dropped a 2 lb weight on her right foot this AM while exercising. Patient states the pain is worsening.

## 2018-01-28 NOTE — Discharge Instructions (Addendum)
Follow up with your doctor. Take tylenol as needed for pain. Return here as needed.

## 2018-02-06 ENCOUNTER — Other Ambulatory Visit: Payer: Self-pay | Admitting: Geriatric Medicine

## 2018-02-06 ENCOUNTER — Ambulatory Visit
Admission: RE | Admit: 2018-02-06 | Discharge: 2018-02-06 | Disposition: A | Discharge status: Home / Self Care | Payer: Medicare HMO | Source: Ambulatory Visit | Attending: Geriatric Medicine | Admitting: Geriatric Medicine

## 2018-02-06 DIAGNOSIS — R918 Other nonspecific abnormal finding of lung field: Secondary | ICD-10-CM

## 2018-02-06 DIAGNOSIS — J189 Pneumonia, unspecified organism: Secondary | ICD-10-CM | POA: Diagnosis not present

## 2018-02-19 DIAGNOSIS — G2581 Restless legs syndrome: Secondary | ICD-10-CM | POA: Diagnosis not present

## 2018-02-19 DIAGNOSIS — R69 Illness, unspecified: Secondary | ICD-10-CM | POA: Diagnosis not present

## 2018-02-19 DIAGNOSIS — N951 Menopausal and female climacteric states: Secondary | ICD-10-CM | POA: Diagnosis not present

## 2018-02-19 DIAGNOSIS — Z79899 Other long term (current) drug therapy: Secondary | ICD-10-CM | POA: Diagnosis not present

## 2018-02-19 DIAGNOSIS — Z23 Encounter for immunization: Secondary | ICD-10-CM | POA: Diagnosis not present

## 2018-02-19 DIAGNOSIS — E7800 Pure hypercholesterolemia, unspecified: Secondary | ICD-10-CM | POA: Diagnosis not present

## 2018-02-19 DIAGNOSIS — Z0000 Encounter for general adult medical examination without abnormal findings: Secondary | ICD-10-CM | POA: Diagnosis not present

## 2018-03-19 DIAGNOSIS — L718 Other rosacea: Secondary | ICD-10-CM | POA: Diagnosis not present

## 2019-01-13 DIAGNOSIS — H5203 Hypermetropia, bilateral: Secondary | ICD-10-CM | POA: Diagnosis not present

## 2019-01-13 DIAGNOSIS — H524 Presbyopia: Secondary | ICD-10-CM | POA: Diagnosis not present

## 2019-01-13 DIAGNOSIS — H52222 Regular astigmatism, left eye: Secondary | ICD-10-CM | POA: Diagnosis not present

## 2019-02-20 DIAGNOSIS — R69 Illness, unspecified: Secondary | ICD-10-CM | POA: Diagnosis not present

## 2019-09-25 DIAGNOSIS — Z1389 Encounter for screening for other disorder: Secondary | ICD-10-CM | POA: Diagnosis not present

## 2019-09-25 DIAGNOSIS — Z780 Asymptomatic menopausal state: Secondary | ICD-10-CM | POA: Diagnosis not present

## 2019-09-25 DIAGNOSIS — Z0000 Encounter for general adult medical examination without abnormal findings: Secondary | ICD-10-CM | POA: Diagnosis not present

## 2019-09-25 DIAGNOSIS — M8580 Other specified disorders of bone density and structure, unspecified site: Secondary | ICD-10-CM | POA: Diagnosis not present

## 2019-09-25 DIAGNOSIS — Z79899 Other long term (current) drug therapy: Secondary | ICD-10-CM | POA: Diagnosis not present

## 2019-09-25 DIAGNOSIS — G2581 Restless legs syndrome: Secondary | ICD-10-CM | POA: Diagnosis not present

## 2019-09-25 DIAGNOSIS — R69 Illness, unspecified: Secondary | ICD-10-CM | POA: Diagnosis not present

## 2019-09-25 DIAGNOSIS — E7800 Pure hypercholesterolemia, unspecified: Secondary | ICD-10-CM | POA: Diagnosis not present

## 2019-10-14 DIAGNOSIS — R69 Illness, unspecified: Secondary | ICD-10-CM | POA: Diagnosis not present

## 2020-01-22 DIAGNOSIS — H524 Presbyopia: Secondary | ICD-10-CM | POA: Diagnosis not present

## 2020-03-26 IMAGING — CR DG CHEST 2V
2 series · 2 of 2 positions shown · non-contrast
Comparison: 03/20/2012

CLINICAL DATA: Dry cough for several weeks

EXAM:
CHEST - 2 VIEW

[w chest pa]
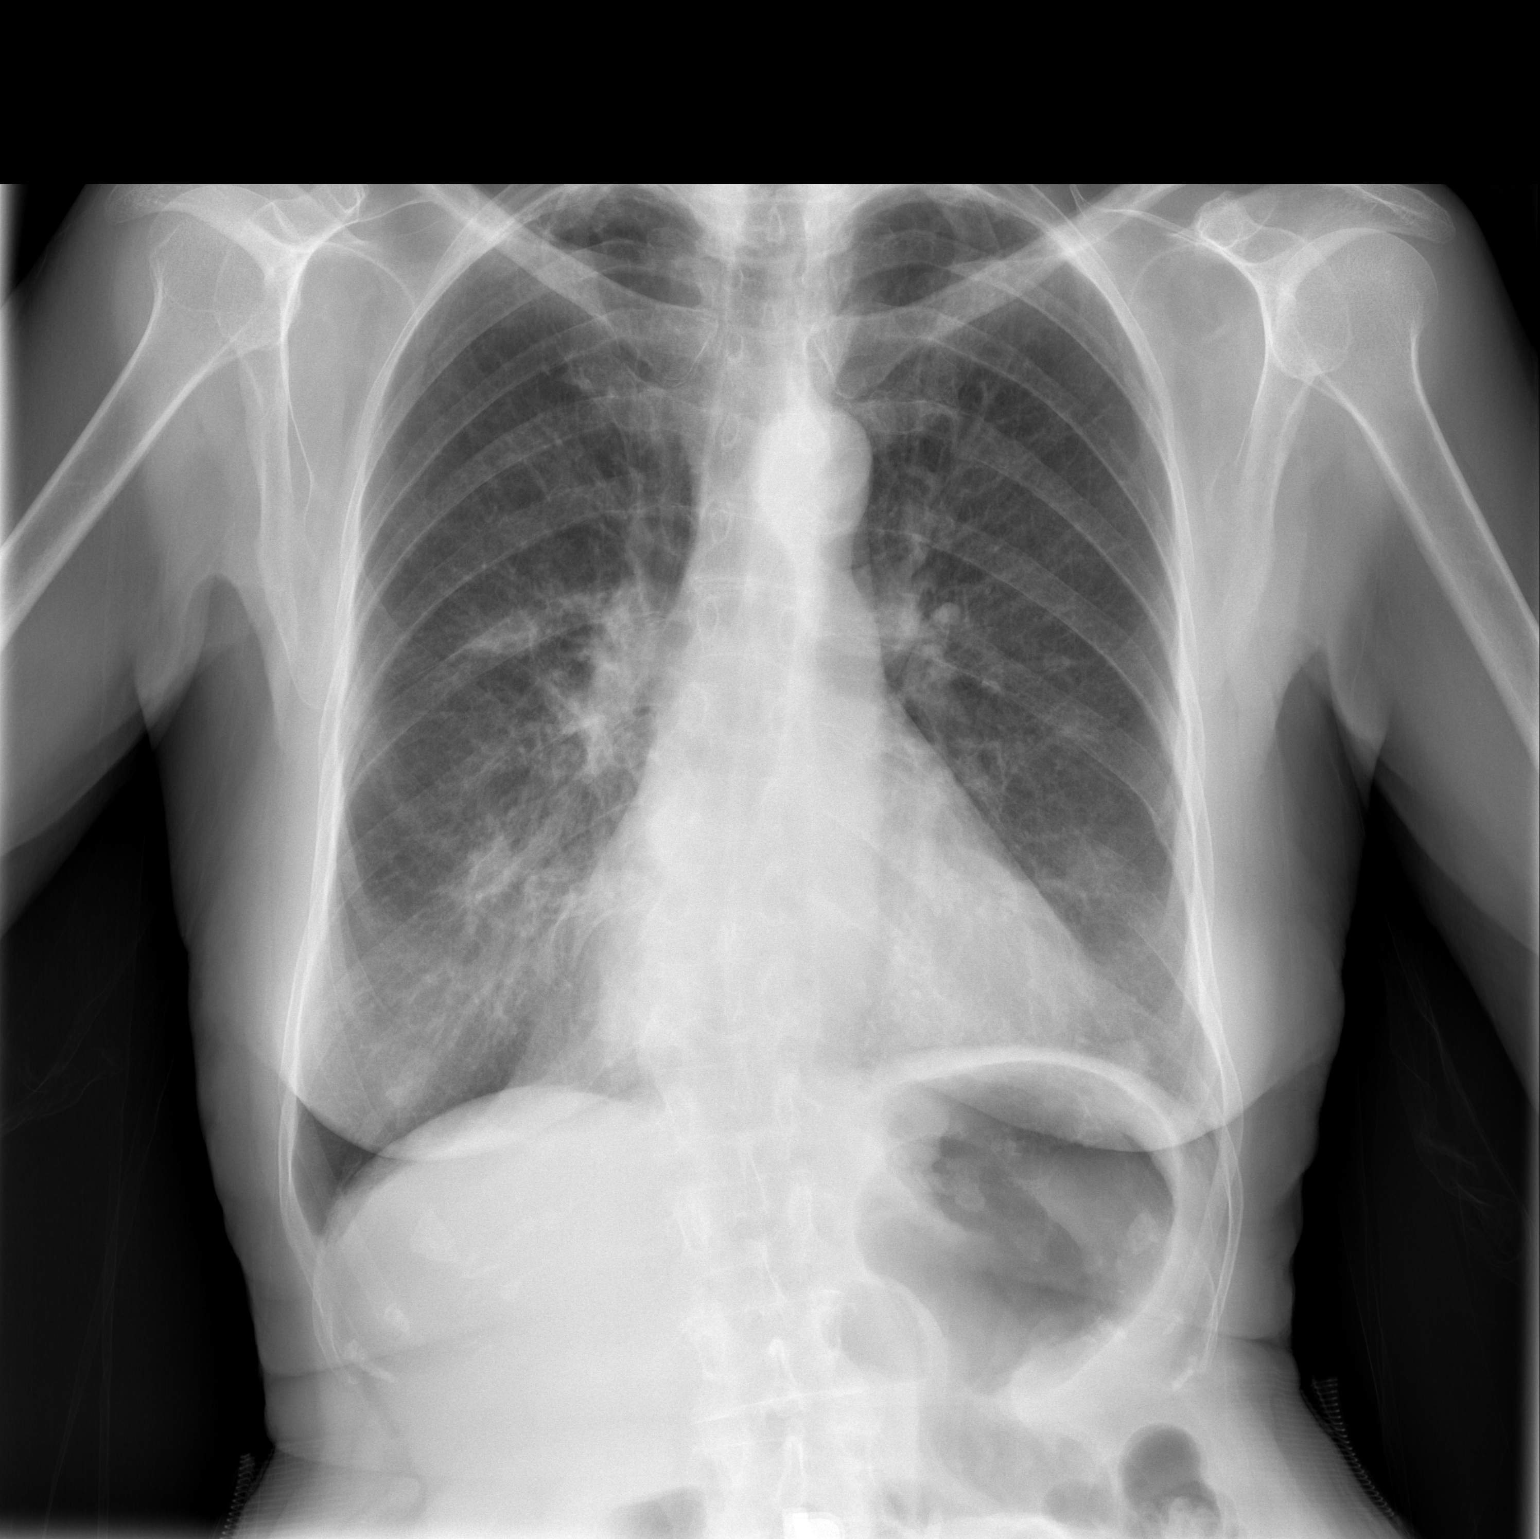

[w chest lat]
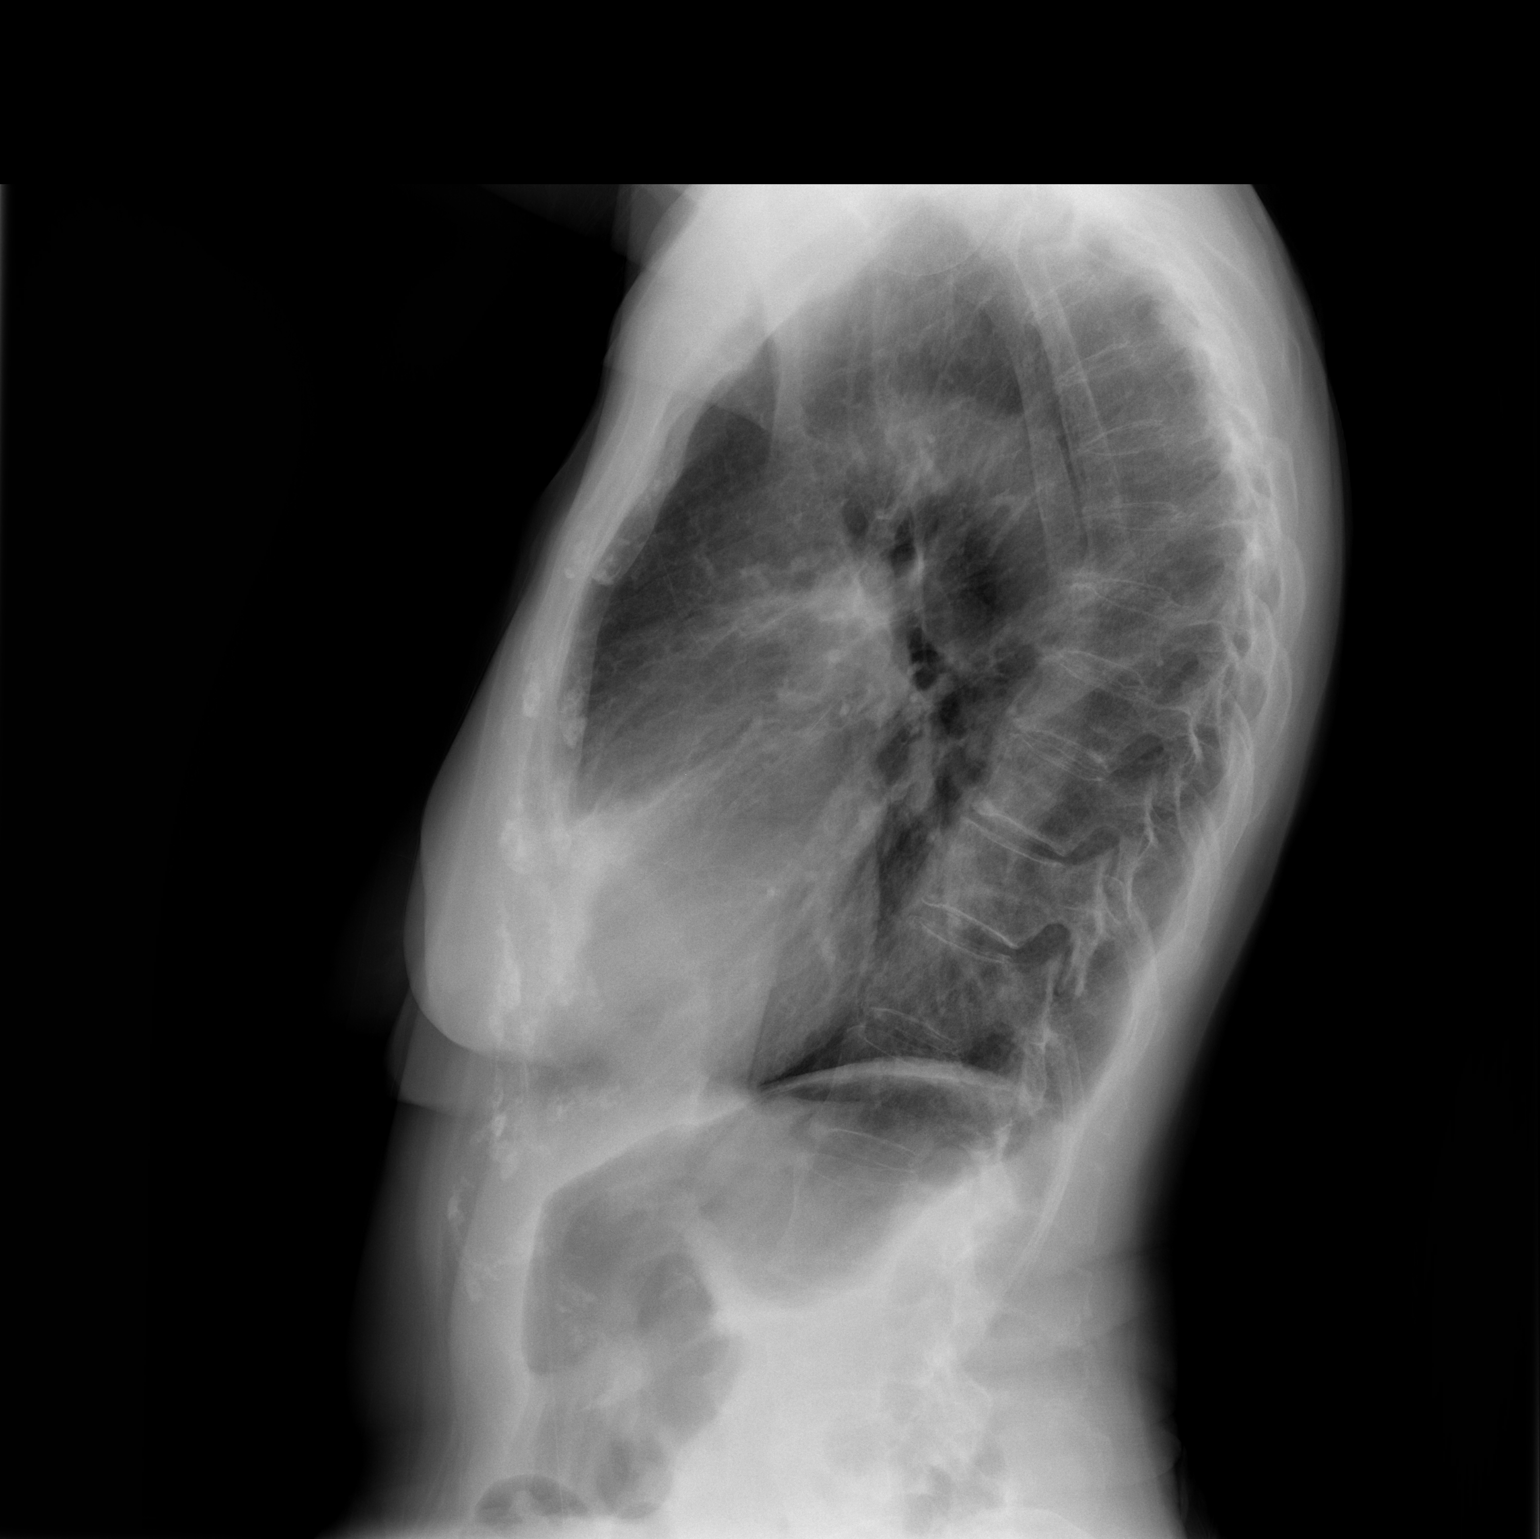

[2 of 2 positions shown; findings below may reference images not displayed]

FINDINGS: Cardiac shadow is stable. The lungs are well aerated bilaterally.
Some patchy infiltrates are noted in the right upper lobe and right
middle lobe when compared with the prior exam. Bilateral nipple
shadows are noted. No acute bony abnormality is seen.
IMPRESSION: Patchy right lung infiltrates. Followup PA and lateral chest X-ray
is recommended in 3-4 weeks following trial of antibiotic therapy to
ensure resolution and exclude underlying malignancy.

## 2020-03-30 DIAGNOSIS — Z803 Family history of malignant neoplasm of breast: Secondary | ICD-10-CM | POA: Diagnosis not present

## 2020-03-30 DIAGNOSIS — Z1231 Encounter for screening mammogram for malignant neoplasm of breast: Secondary | ICD-10-CM | POA: Diagnosis not present

## 2020-04-12 DIAGNOSIS — Z0100 Encounter for examination of eyes and vision without abnormal findings: Secondary | ICD-10-CM | POA: Diagnosis not present

## 2020-08-12 DIAGNOSIS — L905 Scar conditions and fibrosis of skin: Secondary | ICD-10-CM | POA: Diagnosis not present

## 2020-08-12 DIAGNOSIS — L718 Other rosacea: Secondary | ICD-10-CM | POA: Diagnosis not present

## 2020-08-12 DIAGNOSIS — L570 Actinic keratosis: Secondary | ICD-10-CM | POA: Diagnosis not present

## 2020-08-12 DIAGNOSIS — D225 Melanocytic nevi of trunk: Secondary | ICD-10-CM | POA: Diagnosis not present

## 2020-08-12 DIAGNOSIS — L821 Other seborrheic keratosis: Secondary | ICD-10-CM | POA: Diagnosis not present

## 2020-08-12 DIAGNOSIS — Z85828 Personal history of other malignant neoplasm of skin: Secondary | ICD-10-CM | POA: Diagnosis not present

## 2020-08-12 DIAGNOSIS — L111 Transient acantholytic dermatosis [Grover]: Secondary | ICD-10-CM | POA: Diagnosis not present

## 2020-08-12 DIAGNOSIS — L814 Other melanin hyperpigmentation: Secondary | ICD-10-CM | POA: Diagnosis not present

## 2020-10-01 DIAGNOSIS — G2581 Restless legs syndrome: Secondary | ICD-10-CM | POA: Diagnosis not present

## 2020-10-01 DIAGNOSIS — Z0000 Encounter for general adult medical examination without abnormal findings: Secondary | ICD-10-CM | POA: Diagnosis not present

## 2020-10-01 DIAGNOSIS — Z1389 Encounter for screening for other disorder: Secondary | ICD-10-CM | POA: Diagnosis not present

## 2020-10-01 DIAGNOSIS — R69 Illness, unspecified: Secondary | ICD-10-CM | POA: Diagnosis not present

## 2020-10-01 DIAGNOSIS — E7800 Pure hypercholesterolemia, unspecified: Secondary | ICD-10-CM | POA: Diagnosis not present

## 2020-10-01 DIAGNOSIS — M810 Age-related osteoporosis without current pathological fracture: Secondary | ICD-10-CM | POA: Diagnosis not present

## 2020-10-01 DIAGNOSIS — Z7189 Other specified counseling: Secondary | ICD-10-CM | POA: Diagnosis not present

## 2020-10-01 DIAGNOSIS — Z79899 Other long term (current) drug therapy: Secondary | ICD-10-CM | POA: Diagnosis not present

## 2020-11-24 DIAGNOSIS — N390 Urinary tract infection, site not specified: Secondary | ICD-10-CM | POA: Diagnosis not present

## 2020-12-29 DIAGNOSIS — R350 Frequency of micturition: Secondary | ICD-10-CM | POA: Diagnosis not present

## 2020-12-29 DIAGNOSIS — N3000 Acute cystitis without hematuria: Secondary | ICD-10-CM | POA: Diagnosis not present

## 2020-12-30 DIAGNOSIS — N390 Urinary tract infection, site not specified: Secondary | ICD-10-CM | POA: Diagnosis not present

## 2021-02-01 DIAGNOSIS — H5203 Hypermetropia, bilateral: Secondary | ICD-10-CM | POA: Diagnosis not present

## 2021-02-02 DIAGNOSIS — Z0100 Encounter for examination of eyes and vision without abnormal findings: Secondary | ICD-10-CM | POA: Diagnosis not present

## 2021-04-14 DIAGNOSIS — Z1231 Encounter for screening mammogram for malignant neoplasm of breast: Secondary | ICD-10-CM | POA: Diagnosis not present

## 2021-12-01 DIAGNOSIS — E7800 Pure hypercholesterolemia, unspecified: Secondary | ICD-10-CM | POA: Diagnosis not present

## 2021-12-01 DIAGNOSIS — G2581 Restless legs syndrome: Secondary | ICD-10-CM | POA: Diagnosis not present

## 2021-12-01 DIAGNOSIS — Z6821 Body mass index (BMI) 21.0-21.9, adult: Secondary | ICD-10-CM | POA: Diagnosis not present

## 2021-12-01 DIAGNOSIS — Z0000 Encounter for general adult medical examination without abnormal findings: Secondary | ICD-10-CM | POA: Diagnosis not present

## 2021-12-05 ENCOUNTER — Other Ambulatory Visit: Payer: Self-pay | Admitting: Internal Medicine

## 2021-12-05 DIAGNOSIS — M810 Age-related osteoporosis without current pathological fracture: Secondary | ICD-10-CM

## 2021-12-05 DIAGNOSIS — Z1231 Encounter for screening mammogram for malignant neoplasm of breast: Secondary | ICD-10-CM

## 2021-12-19 DIAGNOSIS — K5901 Slow transit constipation: Secondary | ICD-10-CM | POA: Diagnosis not present

## 2021-12-19 DIAGNOSIS — D649 Anemia, unspecified: Secondary | ICD-10-CM | POA: Diagnosis not present

## 2021-12-28 ENCOUNTER — Other Ambulatory Visit: Payer: Medicare HMO

## 2022-02-02 DIAGNOSIS — D509 Iron deficiency anemia, unspecified: Secondary | ICD-10-CM | POA: Diagnosis not present

## 2022-02-02 DIAGNOSIS — N951 Menopausal and female climacteric states: Secondary | ICD-10-CM | POA: Diagnosis not present

## 2022-02-02 DIAGNOSIS — Z0000 Encounter for general adult medical examination without abnormal findings: Secondary | ICD-10-CM | POA: Diagnosis not present

## 2022-02-02 DIAGNOSIS — R69 Illness, unspecified: Secondary | ICD-10-CM | POA: Diagnosis not present

## 2022-02-02 DIAGNOSIS — Z1389 Encounter for screening for other disorder: Secondary | ICD-10-CM | POA: Diagnosis not present

## 2022-02-02 DIAGNOSIS — E7800 Pure hypercholesterolemia, unspecified: Secondary | ICD-10-CM | POA: Diagnosis not present

## 2022-02-02 DIAGNOSIS — M810 Age-related osteoporosis without current pathological fracture: Secondary | ICD-10-CM | POA: Diagnosis not present

## 2022-02-02 DIAGNOSIS — M8580 Other specified disorders of bone density and structure, unspecified site: Secondary | ICD-10-CM | POA: Diagnosis not present

## 2022-02-02 DIAGNOSIS — K5901 Slow transit constipation: Secondary | ICD-10-CM | POA: Diagnosis not present

## 2022-02-02 DIAGNOSIS — G2581 Restless legs syndrome: Secondary | ICD-10-CM | POA: Diagnosis not present

## 2022-02-13 DIAGNOSIS — H524 Presbyopia: Secondary | ICD-10-CM | POA: Diagnosis not present

## 2022-02-13 DIAGNOSIS — Z0100 Encounter for examination of eyes and vision without abnormal findings: Secondary | ICD-10-CM | POA: Diagnosis not present

## 2022-03-06 DIAGNOSIS — M8589 Other specified disorders of bone density and structure, multiple sites: Secondary | ICD-10-CM | POA: Diagnosis not present

## 2022-03-06 DIAGNOSIS — Z780 Asymptomatic menopausal state: Secondary | ICD-10-CM | POA: Diagnosis not present

## 2022-03-06 DIAGNOSIS — M810 Age-related osteoporosis without current pathological fracture: Secondary | ICD-10-CM | POA: Diagnosis not present

## 2022-03-14 DIAGNOSIS — M810 Age-related osteoporosis without current pathological fracture: Secondary | ICD-10-CM | POA: Diagnosis not present

## 2022-03-20 DIAGNOSIS — R300 Dysuria: Secondary | ICD-10-CM | POA: Diagnosis not present

## 2022-04-20 DIAGNOSIS — Z1231 Encounter for screening mammogram for malignant neoplasm of breast: Secondary | ICD-10-CM | POA: Diagnosis not present

## 2022-07-25 DIAGNOSIS — N8110 Cystocele, unspecified: Secondary | ICD-10-CM | POA: Diagnosis not present

## 2022-10-03 DIAGNOSIS — N8110 Cystocele, unspecified: Secondary | ICD-10-CM | POA: Diagnosis not present

## 2022-11-02 DIAGNOSIS — N816 Rectocele: Secondary | ICD-10-CM | POA: Diagnosis not present

## 2022-11-02 DIAGNOSIS — N8110 Cystocele, unspecified: Secondary | ICD-10-CM | POA: Diagnosis not present

## 2022-11-14 DIAGNOSIS — Z4689 Encounter for fitting and adjustment of other specified devices: Secondary | ICD-10-CM | POA: Diagnosis not present

## 2022-11-14 DIAGNOSIS — N816 Rectocele: Secondary | ICD-10-CM | POA: Diagnosis not present

## 2022-11-14 DIAGNOSIS — N8110 Cystocele, unspecified: Secondary | ICD-10-CM | POA: Diagnosis not present

## 2022-11-27 DIAGNOSIS — N898 Other specified noninflammatory disorders of vagina: Secondary | ICD-10-CM | POA: Diagnosis not present

## 2022-11-27 DIAGNOSIS — N8110 Cystocele, unspecified: Secondary | ICD-10-CM | POA: Diagnosis not present

## 2022-11-27 DIAGNOSIS — T8389XA Other specified complication of genitourinary prosthetic devices, implants and grafts, initial encounter: Secondary | ICD-10-CM | POA: Diagnosis not present

## 2022-11-27 DIAGNOSIS — N816 Rectocele: Secondary | ICD-10-CM | POA: Diagnosis not present

## 2022-12-11 DIAGNOSIS — N816 Rectocele: Secondary | ICD-10-CM | POA: Diagnosis not present

## 2022-12-11 DIAGNOSIS — Z4689 Encounter for fitting and adjustment of other specified devices: Secondary | ICD-10-CM | POA: Diagnosis not present

## 2022-12-11 DIAGNOSIS — N8110 Cystocele, unspecified: Secondary | ICD-10-CM | POA: Diagnosis not present

## 2022-12-15 DIAGNOSIS — R3989 Other symptoms and signs involving the genitourinary system: Secondary | ICD-10-CM | POA: Diagnosis not present

## 2022-12-15 DIAGNOSIS — N3946 Mixed incontinence: Secondary | ICD-10-CM | POA: Diagnosis not present

## 2022-12-28 DIAGNOSIS — Z4689 Encounter for fitting and adjustment of other specified devices: Secondary | ICD-10-CM | POA: Diagnosis not present

## 2022-12-28 DIAGNOSIS — N3946 Mixed incontinence: Secondary | ICD-10-CM | POA: Diagnosis not present

## 2023-01-05 DIAGNOSIS — N816 Rectocele: Secondary | ICD-10-CM | POA: Diagnosis not present

## 2023-01-05 DIAGNOSIS — N8110 Cystocele, unspecified: Secondary | ICD-10-CM | POA: Diagnosis not present

## 2023-02-07 DIAGNOSIS — E7800 Pure hypercholesterolemia, unspecified: Secondary | ICD-10-CM | POA: Diagnosis not present

## 2023-02-07 DIAGNOSIS — Z1331 Encounter for screening for depression: Secondary | ICD-10-CM | POA: Diagnosis not present

## 2023-02-07 DIAGNOSIS — Z0000 Encounter for general adult medical examination without abnormal findings: Secondary | ICD-10-CM | POA: Diagnosis not present

## 2023-02-07 DIAGNOSIS — D509 Iron deficiency anemia, unspecified: Secondary | ICD-10-CM | POA: Diagnosis not present

## 2023-02-07 DIAGNOSIS — Z79899 Other long term (current) drug therapy: Secondary | ICD-10-CM | POA: Diagnosis not present

## 2023-02-07 DIAGNOSIS — M810 Age-related osteoporosis without current pathological fracture: Secondary | ICD-10-CM | POA: Diagnosis not present

## 2023-02-07 DIAGNOSIS — Z23 Encounter for immunization: Secondary | ICD-10-CM | POA: Diagnosis not present

## 2023-02-28 DIAGNOSIS — H524 Presbyopia: Secondary | ICD-10-CM | POA: Diagnosis not present

## 2023-04-26 DIAGNOSIS — Z1231 Encounter for screening mammogram for malignant neoplasm of breast: Secondary | ICD-10-CM | POA: Diagnosis not present

## 2023-10-13 DIAGNOSIS — L03213 Periorbital cellulitis: Secondary | ICD-10-CM | POA: Diagnosis not present

## 2023-10-13 DIAGNOSIS — R0982 Postnasal drip: Secondary | ICD-10-CM | POA: Diagnosis not present

## 2023-10-13 DIAGNOSIS — E7800 Pure hypercholesterolemia, unspecified: Secondary | ICD-10-CM | POA: Diagnosis not present

## 2023-10-13 DIAGNOSIS — H00012 Hordeolum externum right lower eyelid: Secondary | ICD-10-CM | POA: Diagnosis not present

## 2023-10-13 DIAGNOSIS — R051 Acute cough: Secondary | ICD-10-CM | POA: Diagnosis not present

## 2023-10-13 DIAGNOSIS — F325 Major depressive disorder, single episode, in full remission: Secondary | ICD-10-CM | POA: Diagnosis not present

## 2023-10-13 DIAGNOSIS — M810 Age-related osteoporosis without current pathological fracture: Secondary | ICD-10-CM | POA: Diagnosis not present

## 2023-10-31 ENCOUNTER — Encounter (HOSPITAL_BASED_OUTPATIENT_CLINIC_OR_DEPARTMENT_OTHER): Payer: Self-pay | Admitting: Emergency Medicine

## 2023-10-31 ENCOUNTER — Other Ambulatory Visit: Payer: Self-pay

## 2023-10-31 ENCOUNTER — Emergency Department (HOSPITAL_BASED_OUTPATIENT_CLINIC_OR_DEPARTMENT_OTHER)
Admission: EM | Admit: 2023-10-31 | Discharge: 2023-10-31 | Disposition: A | Discharge status: Home / Self Care | Attending: Emergency Medicine | Admitting: Emergency Medicine

## 2023-10-31 ENCOUNTER — Emergency Department (HOSPITAL_BASED_OUTPATIENT_CLINIC_OR_DEPARTMENT_OTHER)

## 2023-10-31 ENCOUNTER — Emergency Department (HOSPITAL_BASED_OUTPATIENT_CLINIC_OR_DEPARTMENT_OTHER): Admitting: Radiology

## 2023-10-31 DIAGNOSIS — S0003XA Contusion of scalp, initial encounter: Secondary | ICD-10-CM | POA: Insufficient documentation

## 2023-10-31 DIAGNOSIS — S0990XA Unspecified injury of head, initial encounter: Secondary | ICD-10-CM | POA: Diagnosis not present

## 2023-10-31 DIAGNOSIS — I6523 Occlusion and stenosis of bilateral carotid arteries: Secondary | ICD-10-CM | POA: Diagnosis not present

## 2023-10-31 DIAGNOSIS — Y9222 Religious institution as the place of occurrence of the external cause: Secondary | ICD-10-CM | POA: Diagnosis not present

## 2023-10-31 DIAGNOSIS — S80812A Abrasion, left lower leg, initial encounter: Secondary | ICD-10-CM | POA: Diagnosis not present

## 2023-10-31 DIAGNOSIS — S8002XA Contusion of left knee, initial encounter: Secondary | ICD-10-CM | POA: Diagnosis not present

## 2023-10-31 DIAGNOSIS — W01198A Fall on same level from slipping, tripping and stumbling with subsequent striking against other object, initial encounter: Secondary | ICD-10-CM | POA: Diagnosis not present

## 2023-10-31 DIAGNOSIS — S199XXA Unspecified injury of neck, initial encounter: Secondary | ICD-10-CM | POA: Diagnosis not present

## 2023-10-31 DIAGNOSIS — S80212A Abrasion, left knee, initial encounter: Secondary | ICD-10-CM | POA: Diagnosis not present

## 2023-10-31 DIAGNOSIS — S0001XA Abrasion of scalp, initial encounter: Secondary | ICD-10-CM | POA: Diagnosis not present

## 2023-10-31 NOTE — ED Provider Notes (Signed)
 [place] [facility] DEPARTMENT AT [facility] Provider Note   CSN: [id number] Arrival date & time: 10/31/23  1404     Patient presents with: No chief complaint on file.   Alexa Palmer is a 89 y.o. female.   Patient reports that she tripped and fell.  Patient hit a window with her hand.  Patient reports that she hit her left knee.  Patient complains of bruising to her forehead and an abrasion to her left knee.  Patient reports she was able to stand up she has been able to walk.  She denies any loss of consciousness.  She denies any neck or back pain.  Patient denies any pain in her chest.  No hand or arm pain.  Patient denies any nausea or vomiting.  Patient has not had any vision or hearing changes.  Patient is here with her daughter who is supportive  The history is provided by the patient. No language interpreter was used.       Prior to Admission medications   Medication Sig Start Date End Date Taking? Authorizing Provider  clonazePAM (KLONOPIN) 0.5 MG tablet Take 0.5 mg by mouth every evening.    [provider]  estradiol (ESTRACE) 1 MG tablet Take 1 mg by mouth daily.    [provider]  FLUoxetine (PROZAC) 10 MG capsule Take 10 mg by mouth daily.    [provider]  HYDROcodone -acetaminophen  (NORCO/VICODIN) 5-325 MG per tablet Take one every 6 hours for pain not helped by tylenol  or motrin  03/20/12   Zammit, Joseph, MD    Allergies: Penicillins    Review of Systems  All other systems reviewed and are negative.   Updated Vital Signs BP (!) 125/56 (BP Location: Right Arm)   Pulse (!) 58   Temp 97.8 F (36.6 C) (Oral)   Resp 16   Ht 5' 2 (1.575 m)   Wt 50.8 kg   SpO2 99%   BMI 20.49 kg/m   Physical Exam Vitals and nursing note reviewed.  Constitutional:      Appearance: Normal appearance. She is well-developed.  HENT:     Head: Normocephalic.     Comments: 4 cm bruised area right frontal scalp, tender to palpation.     Nose: Nose normal.     Mouth/Throat:     Mouth: Mucous membranes are moist.   Eyes:     Extraocular Movements: Extraocular movements intact.     Pupils: Pupils are equal, round, and reactive to light.    Cardiovascular:     Rate and Rhythm: Normal rate.  Pulmonary:     Effort: Pulmonary effort is normal.  Abdominal:     General: There is no distension.   Musculoskeletal:        General: Normal range of motion.     Cervical back: Normal range of motion.     Comments: 1 cm abrasion left leg full range of motion bilateral legs neurovascular neurosensory intact   Skin:    General: Skin is warm.   Neurological:     General: No focal deficit present.     Mental Status: She is alert and oriented to person, place, and time.   Psychiatric:        Mood and Affect: Mood normal.     (all labs ordered are listed, but only abnormal results are displayed) Labs Reviewed - No data to display  EKG: None  Radiology: CT Head Wo Contrast Result Date: 10/31/2023 CLINICAL DATA:  Neck trauma (Age >=  65y); Head trauma, minor (Age >= 65y) EXAM: CT HEAD WITHOUT CONTRAST CT CERVICAL SPINE WITHOUT CONTRAST TECHNIQUE: Multidetector CT imaging of the head and cervical spine was performed following the standard protocol without intravenous contrast. Multiplanar CT image reconstructions of the cervical spine were also generated. RADIATION DOSE REDUCTION: This exam was performed according to the departmental dose-optimization program which includes automated exposure control, adjustment of the mA and/or kV according to patient size and/or use of iterative reconstruction technique. COMPARISON:  CT head and C-spine 03/20/2012 FINDINGS: CT HEAD FINDINGS Brain: Atherosclerotic calcifications are present within the cavernous internal carotid arteries. No evidence of large-territorial acute infarction. No parenchymal hemorrhage. No mass lesion. No extra-axial collection. No mass effect or midline shift. No  hydrocephalus. Basilar cisterns are patent. Vascular: No hyperdense vessel. Skull: No acute fracture or focal lesion. Sinuses/Orbits: Paranasal sinuses and mastoid air cells are clear. Bilateral lens replacement. Otherwise the orbits are unremarkable. Other: 6 mm right frontal scalp hematoma. CT CERVICAL SPINE FINDINGS Alignment: Normal. Skull base and vertebrae: Multilevel moderate degenerative changes spine. No associated severe osseous neural foraminal or central canal stenosis. No acute fracture. No aggressive appearing focal osseous lesion or focal pathologic process. Soft tissues and spinal canal: No prevertebral fluid or swelling. No visible canal hematoma. Upper chest: Biapical pleural/pulmonary scarring. Other: None. IMPRESSION: 1. No acute intracranial abnormality. 2. No acute displaced fracture or traumatic listhesis of the cervical spine. Electronically Signed   By: Morgane  Naveau M.D.   On: 10/31/2023 16:16   CT Cervical Spine Wo Contrast Result Date: 10/31/2023 CLINICAL DATA:  Neck trauma (Age >= 65y); Head trauma, minor (Age >= 65y) EXAM: CT HEAD WITHOUT CONTRAST CT CERVICAL SPINE WITHOUT CONTRAST TECHNIQUE: Multidetector CT imaging of the head and cervical spine was performed following the standard protocol without intravenous contrast. Multiplanar CT image reconstructions of the cervical spine were also generated. RADIATION DOSE REDUCTION: This exam was performed according to the departmental dose-optimization program which includes automated exposure control, adjustment of the mA and/or kV according to patient size and/or use of iterative reconstruction technique. COMPARISON:  CT head and C-spine 03/20/2012 FINDINGS: CT HEAD FINDINGS Brain: Atherosclerotic calcifications are present within the cavernous internal carotid arteries. No evidence of large-territorial acute infarction. No parenchymal hemorrhage. No mass lesion. No extra-axial collection. No mass effect or midline shift. No  hydrocephalus. Basilar cisterns are patent. Vascular: No hyperdense vessel. Skull: No acute fracture or focal lesion. Sinuses/Orbits: Paranasal sinuses and mastoid air cells are clear. Bilateral lens replacement. Otherwise the orbits are unremarkable. Other: 6 mm right frontal scalp hematoma. CT CERVICAL SPINE FINDINGS Alignment: Normal. Skull base and vertebrae: Multilevel moderate degenerative changes spine. No associated severe osseous neural foraminal or central canal stenosis. No acute fracture. No aggressive appearing focal osseous lesion or focal pathologic process. Soft tissues and spinal canal: No prevertebral fluid or swelling. No visible canal hematoma. Upper chest: Biapical pleural/pulmonary scarring. Other: None. IMPRESSION: 1. No acute intracranial abnormality. 2. No acute displaced fracture or traumatic listhesis of the cervical spine. Electronically Signed   By: Morgane  Naveau M.D.   On: 10/31/2023 16:16     Procedures   Medications Ordered in the ED - No data to display                                  Medical Decision Making Patient reports she fell hitting her head and her left knee  Amount and/or Complexity of  Data Reviewed Independent Historian:     Details: And is here with her daughter who is supportive Radiology: ordered and independent interpretation performed. Decision-making details documented in ED Course.    Details: Head and CT cervical spine showed no acute abnormality.  Patient is counseled on results  Risk Risk Details: Patient counseled on concussive symptoms.  She is advised to take Tylenol  for any discomfort.  She is advised to return to the emergency department if symptoms worsen or change        Final diagnoses:  Contusion of scalp, initial encounter  Abrasion of left knee, initial encounter    ED Discharge Orders     None      An After Visit Summary was printed and given to the patient.     Sandi Crosby, PA-C 10/31/23 1640     Jerilynn Montenegro, MD 10/31/23 1650

## 2023-10-31 NOTE — ED Triage Notes (Signed)
 Pt via pov from home after mechanical fall today at [place]. She hit the top of her head and fell on her left knee. Pt denies use of anticoagulants, states fall was mechanical. No LOC. Small skin tear to left knee. No bleeding from the head. Pt c/o headache. A&O x 4. NAD noted.

## 2023-10-31 NOTE — Discharge Instructions (Signed)
 Tylenol every 4 hours as needed for pain.

## 2023-10-31 NOTE — ED Notes (Signed)
 DC paperwork given and verbally understood.

## 2023-11-09 DIAGNOSIS — Z9181 History of falling: Secondary | ICD-10-CM | POA: Diagnosis not present

## 2023-12-13 DIAGNOSIS — F325 Major depressive disorder, single episode, in full remission: Secondary | ICD-10-CM | POA: Diagnosis not present

## 2023-12-13 DIAGNOSIS — M810 Age-related osteoporosis without current pathological fracture: Secondary | ICD-10-CM | POA: Diagnosis not present

## 2023-12-13 DIAGNOSIS — E7800 Pure hypercholesterolemia, unspecified: Secondary | ICD-10-CM | POA: Diagnosis not present

## 2024-01-13 DIAGNOSIS — M810 Age-related osteoporosis without current pathological fracture: Secondary | ICD-10-CM | POA: Diagnosis not present

## 2024-01-13 DIAGNOSIS — F325 Major depressive disorder, single episode, in full remission: Secondary | ICD-10-CM | POA: Diagnosis not present

## 2024-01-13 DIAGNOSIS — E7800 Pure hypercholesterolemia, unspecified: Secondary | ICD-10-CM | POA: Diagnosis not present

## 2024-02-12 DIAGNOSIS — M810 Age-related osteoporosis without current pathological fracture: Secondary | ICD-10-CM | POA: Diagnosis not present

## 2024-02-12 DIAGNOSIS — F325 Major depressive disorder, single episode, in full remission: Secondary | ICD-10-CM | POA: Diagnosis not present

## 2024-02-12 DIAGNOSIS — E7800 Pure hypercholesterolemia, unspecified: Secondary | ICD-10-CM | POA: Diagnosis not present

## 2024-02-14 DIAGNOSIS — Z79899 Other long term (current) drug therapy: Secondary | ICD-10-CM | POA: Diagnosis not present

## 2024-02-29 DIAGNOSIS — H524 Presbyopia: Secondary | ICD-10-CM | POA: Diagnosis not present

## 2024-03-11 DIAGNOSIS — Z0100 Encounter for examination of eyes and vision without abnormal findings: Secondary | ICD-10-CM | POA: Diagnosis not present

## 2024-03-14 DIAGNOSIS — M810 Age-related osteoporosis without current pathological fracture: Secondary | ICD-10-CM | POA: Diagnosis not present

## 2024-03-14 DIAGNOSIS — F325 Major depressive disorder, single episode, in full remission: Secondary | ICD-10-CM | POA: Diagnosis not present

## 2024-03-14 DIAGNOSIS — E7800 Pure hypercholesterolemia, unspecified: Secondary | ICD-10-CM | POA: Diagnosis not present

## 2024-04-13 DIAGNOSIS — M810 Age-related osteoporosis without current pathological fracture: Secondary | ICD-10-CM | POA: Diagnosis not present

## 2024-04-13 DIAGNOSIS — E7800 Pure hypercholesterolemia, unspecified: Secondary | ICD-10-CM | POA: Diagnosis not present

## 2024-04-13 DIAGNOSIS — F325 Major depressive disorder, single episode, in full remission: Secondary | ICD-10-CM | POA: Diagnosis not present

## 2024-05-01 DIAGNOSIS — Z1231 Encounter for screening mammogram for malignant neoplasm of breast: Secondary | ICD-10-CM | POA: Diagnosis not present
